# Patient Record
Sex: Male | Born: 1985 | Race: White | Hispanic: No | Marital: Married | Smoking: Never smoker
Health system: Southern US, Community
[De-identification: ages and names within clinical notes are randomized; demographics above are authoritative.]

## PROBLEM LIST (undated history)

## (undated) DIAGNOSIS — IMO0002 Reserved for concepts with insufficient information to code with codable children: Secondary | ICD-10-CM

## (undated) DIAGNOSIS — T7840XA Allergy, unspecified, initial encounter: Secondary | ICD-10-CM

## (undated) HISTORY — DX: Reserved for concepts with insufficient information to code with codable children: IMO0002

## (undated) HISTORY — DX: Allergy, unspecified, initial encounter: T78.40XA

---

## 2005-06-20 ENCOUNTER — Inpatient Hospital Stay: Payer: Self-pay | Admitting: Internal Medicine

## 2005-09-09 ENCOUNTER — Emergency Department: Payer: Self-pay | Admitting: Internal Medicine

## 2007-02-03 ENCOUNTER — Ambulatory Visit: Payer: Self-pay | Admitting: Family Medicine

## 2007-05-08 ENCOUNTER — Emergency Department: Payer: Self-pay | Admitting: Emergency Medicine

## 2007-05-19 ENCOUNTER — Ambulatory Visit: Payer: Self-pay | Admitting: Family Medicine

## 2007-05-25 ENCOUNTER — Ambulatory Visit: Payer: Self-pay | Admitting: Family Medicine

## 2007-05-27 ENCOUNTER — Ambulatory Visit: Payer: Self-pay | Admitting: Family Medicine

## 2007-06-07 ENCOUNTER — Ambulatory Visit: Payer: Self-pay | Admitting: Family Medicine

## 2007-06-20 ENCOUNTER — Ambulatory Visit: Payer: Self-pay | Admitting: Gastroenterology

## 2010-10-18 ENCOUNTER — Ambulatory Visit: Payer: Self-pay | Admitting: Family Medicine

## 2011-08-31 ENCOUNTER — Emergency Department: Payer: Self-pay | Admitting: Emergency Medicine

## 2011-09-07 ENCOUNTER — Emergency Department: Payer: Self-pay | Admitting: Emergency Medicine

## 2011-11-30 ENCOUNTER — Ambulatory Visit: Payer: Self-pay

## 2013-06-03 ENCOUNTER — Emergency Department: Payer: Self-pay | Admitting: Emergency Medicine

## 2014-10-08 ENCOUNTER — Other Ambulatory Visit: Payer: Self-pay | Admitting: Family Medicine

## 2014-10-08 DIAGNOSIS — N529 Male erectile dysfunction, unspecified: Secondary | ICD-10-CM

## 2014-10-08 MED ORDER — SILDENAFIL CITRATE 50 MG PO TABS: 50.0000 mg | ORAL_TABLET | Freq: Every day | ORAL | Status: DC | PRN

## 2016-05-06 ENCOUNTER — Encounter: Payer: Self-pay | Admitting: Family Medicine

## 2016-05-06 ENCOUNTER — Ambulatory Visit (INDEPENDENT_AMBULATORY_CARE_PROVIDER_SITE_OTHER): Payer: BLUE CROSS/BLUE SHIELD | Admitting: Family Medicine

## 2016-05-06 VITALS — BP 134/84 | HR 80 | Temp 99.5°F | Resp 16 | Ht 71.0 in | Wt 229.2 lb

## 2016-05-06 DIAGNOSIS — Z130 Encounter for screening for diseases of the blood and blood-forming organs and certain disorders involving the immune mechanism: Secondary | ICD-10-CM | POA: Diagnosis not present

## 2016-05-06 DIAGNOSIS — Z13220 Encounter for screening for lipoid disorders: Secondary | ICD-10-CM | POA: Diagnosis not present

## 2016-05-06 DIAGNOSIS — Z131 Encounter for screening for diabetes mellitus: Secondary | ICD-10-CM

## 2016-05-06 DIAGNOSIS — Z1329 Encounter for screening for other suspected endocrine disorder: Secondary | ICD-10-CM | POA: Diagnosis not present

## 2016-05-06 DIAGNOSIS — Z6831 Body mass index (BMI) 31.0-31.9, adult: Secondary | ICD-10-CM | POA: Diagnosis not present

## 2016-05-06 DIAGNOSIS — R0683 Snoring: Secondary | ICD-10-CM | POA: Diagnosis not present

## 2016-05-06 DIAGNOSIS — Z0000 Encounter for general adult medical examination without abnormal findings: Secondary | ICD-10-CM

## 2016-05-06 DIAGNOSIS — Z3009 Encounter for other general counseling and advice on contraception: Secondary | ICD-10-CM

## 2016-05-06 DIAGNOSIS — E6609 Other obesity due to excess calories: Secondary | ICD-10-CM

## 2016-05-06 LAB — POCT URINALYSIS DIP (MANUAL ENTRY)
Bilirubin, UA: NEGATIVE
Glucose, UA: NEGATIVE
Leukocytes, UA: NEGATIVE
Nitrite, UA: NEGATIVE
Protein Ur, POC: NEGATIVE
Spec Grav, UA: 1.02
Urobilinogen, UA: 0.2
pH, UA: 5.5

## 2016-05-06 NOTE — Progress Notes (Signed)
Subjective:    Patient ID: Joshua Mcfarland, male    DOB: 1985-08-21, 30 y.o.   MRN: [id number]  05/06/2016  Annual Exam   HPI This 31 y.o. male presents for Complete Physical Examination and to reestablish care.  Previous pateint five years ago in [place].   Immunization History  Administered Date(s) Administered  . Influenza-Unspecified 12/25/2015   BP Readings from Last 3 Encounters:  05/06/16 134/84   Wt Readings from Last 3 Encounters:  05/06/16 229 lb 3.2 oz (104 kg)     Review of Systems  Constitutional: Positive for fatigue. Negative for activity change, appetite change, chills, diaphoresis, fever and unexpected weight change.  HENT: Negative for congestion, dental problem, drooling, ear discharge, ear pain, facial swelling, hearing loss, mouth sores, nosebleeds, postnasal drip, rhinorrhea, sinus pressure, sneezing, sore throat, tinnitus, trouble swallowing and voice change.   Eyes: Negative for photophobia, pain, discharge, redness, itching and visual disturbance.  Respiratory: Negative for apnea, cough, choking, chest tightness, shortness of breath, wheezing and stridor.   Cardiovascular: Negative for chest pain, palpitations and leg swelling.  Gastrointestinal: Negative for abdominal pain, blood in stool, constipation, diarrhea, nausea and vomiting.  Endocrine: Negative for cold intolerance, heat intolerance, polydipsia, polyphagia and polyuria.  Genitourinary: Negative for decreased urine volume, difficulty urinating, discharge, dysuria, enuresis, flank pain, frequency, genital sores, hematuria, penile pain, penile swelling, scrotal swelling, testicular pain and urgency.  Musculoskeletal: Negative for arthralgias, back pain, gait problem, joint swelling, myalgias, neck pain and neck stiffness.  Skin: Negative for color change, pallor, rash and wound.  Allergic/Immunologic: Negative for environmental allergies, food allergies and immunocompromised state.    Neurological: Negative for dizziness, tremors, seizures, syncope, facial asymmetry, speech difficulty, weakness, light-headedness, numbness and headaches.  Hematological: Negative for adenopathy. Does not bruise/bleed easily.  Psychiatric/Behavioral: Negative for agitation, behavioral problems, confusion, decreased concentration, dysphoric mood, hallucinations, self-injury, sleep disturbance and suicidal ideas. The patient is not nervous/anxious and is not hyperactive.     Past Medical History:  Diagnosis Date  . Allergy   . Ulcer ([place])    History reviewed. No pertinent surgical history. No Known Allergies Current Outpatient Prescriptions  Medication Sig Dispense Refill  . sildenafil (VIAGRA) 50 MG tablet Take 1 tablet (50 mg total) by mouth daily as needed for erectile dysfunction. (Patient not taking: Reported on 05/06/2016) 24 tablet 3   No current facility-administered medications for this visit.    Social History   Social History  . Marital status: Married    Spouse name: N/A  . Number of children: 3  . 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 Smoking status: Never Smoker  . Smokeless tobacco: Never Used  . Alcohol use No  . Drug use: No  . Sexual activity: Yes    Partners: Female   Other Topics Concern  . Not on file   Social History Narrative   Wife stays home with the children   Wears seatbelt when driving   Does own firearms but they are secured    Family History  Problem Relation Age of Onset  . Hyperlipidemia Father   . Hypertension Father   . Diabetes Paternal Uncle   . Stroke Maternal Grandfather   . Heart disease Paternal Grandmother   . Diabetes Paternal Grandfather   . Hyperlipidemia Paternal Grandfather   . Hypertension Paternal Grandfather        Objective:  BP 134/84 (BP Location: Left Arm, Patient Position: Sitting, Cuff Size: Large)   Pulse 80   Temp 99.5 F  (37.5 C) (Oral)   Resp 16   Ht 5\' 11"  (1.803 m)   Wt 229 lb 3.2 oz (104 kg)   SpO2 97%   BMI 31.97 kg/m  Physical Exam  Constitutional: He is oriented to person, place, and time. He appears well-developed and well-nourished. No distress.  HENT:  Head: Normocephalic and atraumatic.  Right Ear: External ear normal.  Left Ear: External ear normal.  Nose: Nose normal.  Mouth/Throat: Oropharynx is clear and moist.  Eyes: Conjunctivae and EOM are normal. Pupils are equal, round, and reactive to light.  Neck: Normal range of motion. Neck supple. Carotid bruit is not present. No thyromegaly present.  Cardiovascular: Normal rate, regular rhythm, normal heart sounds and intact distal pulses.  Exam reveals no gallop and no friction rub.   No murmur heard. Pulmonary/Chest: Effort normal and breath sounds normal. He has no wheezes. He has no rales.  Abdominal: Soft. Bowel sounds are normal. He exhibits no distension and no mass. There is no tenderness. There is no rebound and no guarding. Hernia confirmed negative in the right inguinal area and confirmed negative in the left inguinal area.  Genitourinary: Testes normal and penis normal.  Musculoskeletal:       Right shoulder: Normal.       Left shoulder: Normal.       Cervical back: Normal.  Lymphadenopathy:    He has no cervical adenopathy.       Right: No inguinal adenopathy present.       Left: No inguinal adenopathy present.  Neurological: He is alert and oriented to person, place, and time. He has normal reflexes. No cranial nerve deficit. He exhibits normal muscle tone. Coordination normal.  Skin: Skin is warm and dry. No rash noted. He is not diaphoretic.  Psychiatric: He has a normal mood and affect. His behavior is normal. Judgment and thought content normal.   Depression screen PHQ 2/9 05/06/2016  Decreased Interest 0  Down, Depressed, Hopeless 0  PHQ - 2 Score 0   Fall Risk  05/06/2016  Falls in the past year? No          Assessment & Plan:   1. Routine physical examination   2. Screening for hyperlipidemia   3. Screening for iron deficiency anemia   4. Screening for hypothyroidism   5. Screening for diabetes mellitus   6. Snoring   7. Family planning counseling   8. Class 1 obesity due to excess calories without serious comorbidity with body mass index (BMI) of 31.0 to 31.9 in adult    -anticipatory guidance --- exercise, weight loss, safe driving practices. -obtain age appropriate screening labs. -refer to urology to undergo vasectomy. -refer for sleep medicine due to snoring and obesity. Orders Placed This Encounter  Procedures  . Lipid panel  . Comprehensive metabolic panel  . CBC with Differential/Platelet  . TSH  . Urine Microscopic  . Hemoglobin A1c  . Ambulatory referral to Urology    Referral Priority:   Routine    Referral Type:   Consultation    Referral Reason:   Specialty Services Required    Requested Specialty:   Urology    Number of Visits Requested:   1  . Ambulatory referral to Sleep Studies    Referral Priority:   Routine    Referral Type:   Consultation    Referral Reason:  Specialty Services Required    Requested Specialty:   Sleep Medicine    Number of Visits Requested:   1  . POCT urinalysis dipstick   No orders of the defined types were placed in this encounter.   Return in about 1 year (around 05/06/2017) for complete physical examiniation.   Aldous Housel Elayne Guerin, M.D. Primary Care at [facility] [address] [place], [place]  [phone] phone [phone] fax

## 2016-05-06 NOTE — Patient Instructions (Addendum)
IF you received an x-ray today, you will receive an invoice from [facility]. Please contact [facility] at [phone] with questions or concerns regarding your invoice.   IF you received labwork today, you will receive an invoice from [facility]. Please contact [facility] at [phone] with questions or concerns regarding your invoice.   Our billing staff will not be able to assist you with questions regarding bills from these companies.  You will be contacted with the lab results as soon as they are available. The fastest way to get your results is to activate your My Chart account. Instructions are located on the last page of this paperwork. If you have not heard from [place] regarding the results in 2 weeks, please contact this office.     PREVE Preventive Care 18-39 Years, Male Preventive care refers to lifestyle choices and visits with your health care provider that can promote health and wellness. What does preventive care include?  A yearly physical exam. This is also called an annual well check.  Dental exams once or twice a year.  Routine eye exams. Ask your health care provider how often you should have your eyes checked.  Personal lifestyle choices, including:  Daily care of your teeth and gums.  Regular physical activity.  Eating a healthy diet.  Avoiding tobacco and drug use.  Limiting alcohol use.  Practicing safe sex. What happens during an annual well check? The services and screenings done by your health care provider during your annual well check will depend on your age, overall health, lifestyle risk factors, and family history of disease. Counseling  Your health care provider may ask you questions about your:  Alcohol use.  Tobacco use.  Drug use.  Emotional well-being.  Home and relationship well-being.  Sexual activity.  Eating habits.  Work and work Statistician. Screening  You may have the following tests or  measurements:  Height, weight, and BMI.  Blood pressure.  Lipid and cholesterol levels. These may be checked every 5 years starting at age 38.  Diabetes screening. This is done by checking your blood sugar (glucose) after you have not eaten for a while (fasting).  Skin check.  Hepatitis C blood test.  Hepatitis B blood test.  Sexually transmitted disease (STD) testing. Discuss your test results, treatment options, and if necessary, the need for more tests with your health care provider. Vaccines  Your health care provider may recommend certain vaccines, such as:  Influenza vaccine. This is recommended every year.  Tetanus, diphtheria, and acellular pertussis (Tdap, Td) vaccine. You may need a Td booster every 10 years.  Varicella vaccine. You may need this if you have not been vaccinated.  HPV vaccine. If you are 19 or younger, you may need three doses over 6 months.  Measles, mumps, and rubella (MMR) vaccine. You may need at least one dose of MMR.You may also need a second dose.  Pneumococcal 13-valent conjugate (PCV13) vaccine. You may need this if you have certain conditions and have not been vaccinated.  Pneumococcal polysaccharide (PPSV23) vaccine. You may need one or two doses if you smoke cigarettes or if you have certain conditions.  Meningococcal vaccine. One dose is recommended if you are age 32-21 years and a first-year college student living in a residence hall, or if you have one of several medical conditions. You may also need additional booster doses.  Hepatitis A vaccine. You may need this if you have certain conditions or if you travel or work  places where you may be exposed to hepatitis A.  Hepatitis B vaccine. You may need this if you have certain conditions or if you travel or work in places where you may be exposed to hepatitis B.  Haemophilus influenzae type b (Hib) vaccine. You may need this if you have certain risk factors.  Talk to your health  care provider about which screenings and vaccines you need and how often you need them. This information is not intended to replace advice given to you by your health care provider. Make sure you discuss any questions you have with your health care provider. Document Released: 04/07/2001 Document Revised: 10/30/2015 Document Reviewed: 12/11/2014 Elsevier Interactive Patient Education  2017 Elsevier Inc.  

## 2016-05-06 NOTE — Progress Notes (Signed)
Patient ID: Joshua Mcfarland, male    DOB: Apr 08, 1985, 31 y.o.   MRN: [id number]  PCP: Reginia Forts, MD  Chief Complaint  Patient presents with  . Annual Exam    Subjective:   Presents for [employer]. No specific complaints today. Does want to discuss vasectomy and sleep apnea study.  Last Physical exam was last year Last Dentist visit was last year Last Eye doctor appointment over two years ago Immunizations are up to date, his nurse at work will send the records to our office.   These are the goals we discussed: Goals    None      This is a list of the screening recommended for you and due dates:  Health Maintenance  Topic Date Due  . HIV Screening  08/03/2000  . Tetanus Vaccine  08/03/2004  . Flu Shot  09/24/2015    Patient Active Problem List   Diagnosis Date Noted  . Screening for hyperlipidemia 05/06/2016  . Screening for hypothyroidism 05/06/2016    Past Medical History:  Diagnosis Date  . Allergy   . Ulcer ([place])      Prior to Admission medications   Medication Sig Start Date End Date Taking? Authorizing Provider  sildenafil (VIAGRA) 50 MG tablet Take 1 tablet (50 mg total) by mouth daily as needed for erectile dysfunction. Patient not taking: Reported on 05/06/2016 10/08/14   Carmon Ginsberg, PA    No Known Allergies  History reviewed. No pertinent surgical history.  Family History  Problem Relation Age of Onset  . Hyperlipidemia Father   . Hypertension Father   . Diabetes Paternal Uncle   . Stroke Maternal Grandfather   . Heart disease Paternal Grandmother   . Diabetes Paternal Grandfather   . Hyperlipidemia Paternal Grandfather   . Hypertension Paternal Grandfather     Social History   Social History  . Marital status: Married    Spouse name: N/A  . Number of children: 3  . 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 Smoking status: Never Smoker  .  Smokeless tobacco: Never Used  . Alcohol use No  . Drug use: No  . Sexual activity: Yes    Partners: Female   Other Topics Concern  . None   Social History Narrative   Wife stays home with the children   Wears seatbelt when driving   Does own firearms but they are secured     Review of Systems  Allergic/Immunologic: Positive for environmental allergies (Seasonal ).  All other systems reviewed and are negative.    Objective:  Physical Exam  Constitutional: He is oriented to person, place, and time. He appears well-developed and well-nourished.  HENT:  Head: Normocephalic.  Right Ear: External ear normal.  Left Ear: External ear normal.  Nose: Nose normal.  Mouth/Throat: Oropharynx is clear and moist.  Eyes: Pupils are equal, round, and reactive to light.  Neck: Normal range of motion. Neck supple.  Cardiovascular: Normal rate, regular rhythm, normal heart sounds and intact distal pulses.   Pulmonary/Chest: Effort normal and breath sounds normal.  Abdominal: Soft. Bowel sounds are normal.  Musculoskeletal: Normal range of motion.  Neurological: He is alert and oriented to person, place, and time. He has normal reflexes.  Skin: Skin is warm and dry.  Psychiatric: He has a normal mood and affect. His behavior is normal. Judgment and thought content  normal.     Assessment & Plan:  1. Routine physical examination  2. Screening for hyperlipidemia - Lipid panel - Comprehensive metabolic panel  3. Screening for iron deficiency anemia - CBC with Differential/Platelet  4. Screening for hypothyroidism - TSH  5. Screening for diabetes mellitus - Urine Microscopic - POCT urinalysis dipstick - Hemoglobin A1c  6. Snoring - Ambulatory referral to Sleep Studies  7. Family planning counseling - Ambulatory referral to Urology

## 2016-05-07 LAB — COMPREHENSIVE METABOLIC PANEL
ALK PHOS: 83 IU/L (ref 39–117)
ALT: 66 IU/L — ABNORMAL HIGH (ref 0–44)
AST: 30 IU/L (ref 0–40)
Albumin/Globulin Ratio: 1.4 (ref 1.2–2.2)
Albumin: 4.7 g/dL (ref 3.5–5.5)
BUN/Creatinine Ratio: 15 (ref 9–20)
BUN: 15 mg/dL (ref 6–20)
Bilirubin Total: 0.6 mg/dL (ref 0.0–1.2)
CALCIUM: 9.6 mg/dL (ref 8.7–10.2)
CO2: 23 mmol/L (ref 18–29)
CREATININE: 0.97 mg/dL (ref 0.76–1.27)
Chloride: 97 mmol/L (ref 96–106)
GFR calc Af Amer: 121 mL/min/{1.73_m2} (ref >59)
GFR, EST NON AFRICAN AMERICAN: 104 mL/min/{1.73_m2} (ref >59)
GLOBULIN, TOTAL: 3.3 g/dL (ref 1.5–4.5)
Glucose: 82 mg/dL (ref 65–99)
POTASSIUM: 4 mmol/L (ref 3.5–5.2)
SODIUM: 140 mmol/L (ref 134–144)
Total Protein: 8 g/dL (ref 6.0–8.5)

## 2016-05-07 LAB — CBC WITH DIFFERENTIAL/PLATELET
Basophils Absolute: 0 10*3/uL (ref 0.0–0.2)
Basos: 0 %
EOS (ABSOLUTE): 0.2 10*3/uL (ref 0.0–0.4)
EOS: 2 %
HEMATOCRIT: 47.3 % (ref 37.5–51.0)
Hemoglobin: 16.8 g/dL (ref 13.0–17.7)
IMMATURE GRANS (ABS): 0.1 10*3/uL (ref 0.0–0.1)
IMMATURE GRANULOCYTES: 1 %
LYMPHS: 26 %
Lymphocytes Absolute: 3.5 10*3/uL — ABNORMAL HIGH (ref 0.7–3.1)
MCH: 29.3 pg (ref 26.6–33.0)
MCHC: 35.5 g/dL (ref 31.5–35.7)
MCV: 83 fL (ref 79–97)
MONOS ABS: 0.9 10*3/uL (ref 0.1–0.9)
Monocytes: 7 %
NEUTROS PCT: 64 %
Neutrophils Absolute: 8.6 10*3/uL — ABNORMAL HIGH (ref 1.4–7.0)
Platelets: 299 10*3/uL (ref 150–379)
RBC: 5.73 x10E6/uL (ref 4.14–5.80)
RDW: 13.2 % (ref 12.3–15.4)
WBC: 13.3 10*3/uL — AB (ref 3.4–10.8)

## 2016-05-07 LAB — HEMOGLOBIN A1C
Est. average glucose Bld gHb Est-mCnc: 108 mg/dL
Hgb A1c MFr Bld: 5.4 % (ref 4.8–5.6)

## 2016-05-07 LAB — LIPID PANEL
CHOL/HDL RATIO: 5.2 ratio — AB (ref 0.0–5.0)
CHOLESTEROL TOTAL: 212 mg/dL — AB (ref 100–199)
HDL: 41 mg/dL (ref >39)
LDL CALC: 139 mg/dL — AB (ref 0–99)
Triglycerides: 161 mg/dL — ABNORMAL HIGH (ref 0–149)
VLDL CHOLESTEROL CAL: 32 mg/dL (ref 5–40)

## 2016-05-07 LAB — URINALYSIS, MICROSCOPIC ONLY
Casts: NONE SEEN /lpf
EPITHELIAL CELLS (NON RENAL): NONE SEEN /HPF (ref 0–10)

## 2016-05-07 LAB — TSH: TSH: 1.95 u[IU]/mL (ref 0.450–4.500)

## 2016-05-13 ENCOUNTER — Encounter: Payer: Self-pay | Admitting: Family Medicine

## 2016-05-13 NOTE — Telephone Encounter (Signed)
Can you look into the sleep study that was placed 3/14 from dr. Tamala Julian

## 2016-06-16 ENCOUNTER — Ambulatory Visit: Payer: BLUE CROSS/BLUE SHIELD | Attending: Internal Medicine

## 2016-06-16 DIAGNOSIS — G4733 Obstructive sleep apnea (adult) (pediatric): Secondary | ICD-10-CM | POA: Insufficient documentation

## 2016-06-16 DIAGNOSIS — R0683 Snoring: Secondary | ICD-10-CM | POA: Diagnosis present

## 2016-06-20 DIAGNOSIS — E6609 Other obesity due to excess calories: Secondary | ICD-10-CM | POA: Insufficient documentation

## 2016-06-20 DIAGNOSIS — Z6831 Body mass index (BMI) 31.0-31.9, adult: Secondary | ICD-10-CM

## 2016-06-24 ENCOUNTER — Encounter: Payer: Self-pay | Admitting: Family Medicine

## 2016-06-29 ENCOUNTER — Encounter: Payer: Self-pay | Admitting: Family Medicine

## 2016-06-30 ENCOUNTER — Encounter: Payer: Self-pay | Admitting: Family Medicine

## 2016-06-30 DIAGNOSIS — G4733 Obstructive sleep apnea (adult) (pediatric): Secondary | ICD-10-CM

## 2016-07-22 ENCOUNTER — Encounter: Payer: Self-pay | Admitting: Family Medicine

## 2016-07-24 NOTE — Telephone Encounter (Signed)
Referrals, patient sent MyChart message regarding sleep study for CPAP titration.  Looks like information was faxed today.  Can you please contact pt via phone and via mychart when you have received an appointment time?

## 2016-08-05 ENCOUNTER — Ambulatory Visit: Payer: BLUE CROSS/BLUE SHIELD | Attending: Neurology

## 2016-08-05 DIAGNOSIS — G4733 Obstructive sleep apnea (adult) (pediatric): Secondary | ICD-10-CM | POA: Diagnosis present

## 2016-08-05 DIAGNOSIS — I493 Ventricular premature depolarization: Secondary | ICD-10-CM | POA: Diagnosis not present

## 2016-08-05 DIAGNOSIS — I491 Atrial premature depolarization: Secondary | ICD-10-CM | POA: Diagnosis not present

## 2016-08-05 DIAGNOSIS — R0683 Snoring: Secondary | ICD-10-CM | POA: Insufficient documentation

## 2016-08-18 ENCOUNTER — Telehealth: Payer: Self-pay | Admitting: Family Medicine

## 2016-08-18 DIAGNOSIS — G4733 Obstructive sleep apnea (adult) (pediatric): Secondary | ICD-10-CM

## 2016-08-18 NOTE — Telephone Encounter (Signed)
Pt is looking for his sleep study results   Best number [phone]

## 2016-08-18 NOTE — Telephone Encounter (Signed)
Dr. Tamala Julian , please release this results, to call the patient. Thanks.

## 2016-08-31 NOTE — Telephone Encounter (Signed)
Please fax order for CPAP machine to [place] for delivery and education for patient.

## 2016-08-31 NOTE — Telephone Encounter (Signed)
Order for CPAP placed in box for signiture

## 2016-09-25 ENCOUNTER — Telehealth: Payer: Self-pay | Admitting: Family Medicine

## 2016-09-25 DIAGNOSIS — G4733 Obstructive sleep apnea (adult) (pediatric): Secondary | ICD-10-CM

## 2016-09-25 NOTE — Telephone Encounter (Signed)
Pt states he has not got the orders for his sleep study.  Please adv.  Contact number [phone]

## 2016-09-26 NOTE — Telephone Encounter (Signed)
Do you have a referral for this already

## 2016-09-29 NOTE — Telephone Encounter (Signed)
The last referral for sleep study was in May of 2018. Sleep referrals are different than orders. I am not able to send orders but if the provider places orders they can be sent to the facility for the sleep study to be done if that it what the pt is referring to. I am not sure if there are supposed to be sleep orders for pt or not.

## 2016-10-01 NOTE — Telephone Encounter (Signed)
Please advise 

## 2016-10-05 NOTE — Telephone Encounter (Signed)
Order for CPAP placed; please coordinate with [facility] facility [facility] or [facility]) to deliver CPAP and provide education.

## 2016-10-05 NOTE — Telephone Encounter (Signed)
Order faxed to [facility]

## 2016-10-06 NOTE — Telephone Encounter (Signed)
Spoke with [facility] and they did receive order and will contact the pt to advise him on obtaining CPAP and instructions, insurance information, and financial information concerning this.

## 2016-10-28 DIAGNOSIS — I10 Essential (primary) hypertension: Secondary | ICD-10-CM | POA: Diagnosis not present

## 2016-10-28 DIAGNOSIS — G4733 Obstructive sleep apnea (adult) (pediatric): Secondary | ICD-10-CM | POA: Diagnosis not present

## 2016-11-05 ENCOUNTER — Emergency Department
Admission: EM | Admit: 2016-11-05 | Discharge: 2016-11-05 | Disposition: A | Discharge status: Home / Self Care | Payer: BLUE CROSS/BLUE SHIELD | Attending: Emergency Medicine | Admitting: Emergency Medicine

## 2016-11-05 ENCOUNTER — Encounter: Payer: Self-pay | Admitting: *Deleted

## 2016-11-05 DIAGNOSIS — S61412A Laceration without foreign body of left hand, initial encounter: Secondary | ICD-10-CM | POA: Diagnosis not present

## 2016-11-05 DIAGNOSIS — W260XXA Contact with knife, initial encounter: Secondary | ICD-10-CM | POA: Insufficient documentation

## 2016-11-05 DIAGNOSIS — Y9389 Activity, other specified: Secondary | ICD-10-CM | POA: Diagnosis not present

## 2016-11-05 DIAGNOSIS — Y999 Unspecified external cause status: Secondary | ICD-10-CM | POA: Insufficient documentation

## 2016-11-05 DIAGNOSIS — Y929 Unspecified place or not applicable: Secondary | ICD-10-CM | POA: Diagnosis not present

## 2016-11-05 DIAGNOSIS — S6992XA Unspecified injury of left wrist, hand and finger(s), initial encounter: Secondary | ICD-10-CM | POA: Diagnosis not present

## 2016-11-05 MED ORDER — LIDOCAINE HCL 1 % IJ SOLN: 5.0000 mL | Freq: Once | INTRAMUSCULAR | Status: DC

## 2016-11-05 MED ORDER — CEPHALEXIN 500 MG PO CAPS: 500.0000 mg | ORAL_CAPSULE | Freq: Three times a day (TID) | ORAL | 0 refills | Status: AC

## 2016-11-05 MED ORDER — LIDOCAINE HCL (PF) 1 % IJ SOLN
5.0000 mL | Freq: Once | INTRAMUSCULAR | Status: AC
  Administered 2016-11-05: 5 mL
  Filled 2016-11-05: qty 5

## 2016-11-05 NOTE — ED Provider Notes (Signed)
[facility] Emergency Department Provider Note  ____________________________________________  Time seen: Approximately 5:58 PM  I have reviewed the triage vital signs and the nursing notes.   HISTORY  Chief Complaint Laceration    HPI Joshua Mcfarland is a 31 y.o. male presenting to the emergency department with a left hand laceration. Patient states that he was attempting to take down a trampoline with a switch blade that slipped. He denies weakness, radiculopathy or changes in sensation of the left hand. Tetanus status updated. No alleviating measures have been attempted.   Past Medical History:  Diagnosis Date  . Allergy   . Ulcer     Patient Active Problem List   Diagnosis Date Noted  . Class 1 obesity due to excess calories without serious comorbidity with body mass index (BMI) of 31.0 to 31.9 in adult 06/20/2016  . Screening for hyperlipidemia 05/06/2016  . Screening for hypothyroidism 05/06/2016    No past surgical history on file.  Prior to Admission medications   Medication Sig Start Date End Date Taking? Authorizing Provider  cephALEXin (KEFLEX) 500 MG capsule Take 1 capsule (500 mg total) by mouth 3 (three) times daily. 11/05/16 11/15/16  Lannie Fields, PA-C  sildenafil (VIAGRA) 50 MG tablet Take 1 tablet (50 mg total) by mouth daily as needed for erectile dysfunction. Patient not taking: Reported on 05/06/2016 10/08/14   Carmon Ginsberg, PA    Allergies Patient has no known allergies.  Family History  Problem Relation Age of Onset  . Hyperlipidemia Father   . Hypertension Father   . Diabetes Paternal Uncle   . Stroke Maternal Grandfather   . Heart disease Paternal Grandmother   . Diabetes Paternal Grandfather   . Hyperlipidemia Paternal Grandfather   . Hypertension Paternal Grandfather     Social History Social History  Substance Use Topics  . Smoking status: Never Smoker  . Smokeless tobacco: Never Used  . Alcohol use No      Review of Systems  Constitutional: No fever/chills Eyes: No visual changes. No discharge ENT: No upper respiratory complaints. Cardiovascular: no chest pain. Respiratory: no cough. No SOB. Musculoskeletal: Negative for musculoskeletal pain. Skin: Patient has left hand laceration.  Neurological: Negative for headaches, focal weakness or numbness.   ____________________________________________   PHYSICAL EXAM:  VITAL SIGNS: ED Triage Vitals  Enc Vitals Group     BP 11/05/16 1648 (!) 147/91     Pulse Rate 11/05/16 1648 80     Resp 11/05/16 1648 18     Temp 11/05/16 1648 99.2 F (37.3 C)     Temp Source 11/05/16 1648 Oral     SpO2 11/05/16 1648 99 %     Weight 11/05/16 1648 218 lb (98.9 kg)     Height 11/05/16 1648 5\' 10"  (1.778 m)     Head Circumference --      Peak Flow --      Pain Score 11/05/16 1647 4     Pain Loc --      Pain Edu? --      Excl. in [place]? --      Constitutional: Alert and oriented. Well appearing and in no acute distress. Eyes: Conjunctivae are normal. PERRL. EOMI. Head: Atraumatic. Cardiovascular: Normal rate, regular rhythm. Normal S1 and S2.  Good peripheral circulation. Respiratory: Normal respiratory effort without tachypnea or retractions. Lungs CTAB. Good air entry to the bases with no decreased or absent breath sounds. Musculoskeletal: Patient is able to perform flexion and extension at the right  wrist. Patient is able to move all 5 right fingers. Patient is able to perform resisted reduction and extension at all 5 fingers. Palpable radial pulse, left. Neurologic:  Normal speech and language. No gross focal neurologic deficits are appreciated.  Skin: Patient has 2.5 cm laceration of left hand.  Psychiatric: Mood and affect are normal. Speech and behavior are normal. Patient exhibits appropriate insight and judgement.   ____________________________________________   LABS (all labs ordered are listed, but only abnormal results are  displayed)  Labs Reviewed - No data to display ____________________________________________  EKG   ____________________________________________  RADIOLOGY   No results found.  ____________________________________________    PROCEDURES  Procedure(s) performed:    Procedures  LACERATION REPAIR Performed by: Lannie Fields Authorized by: Lannie Fields Consent: Verbal consent obtained. Risks and benefits: risks, benefits and alternatives were discussed Consent given by: patient Patient identity confirmed: provided demographic data Prepped and Draped in normal sterile fashion Wound was irrigated with 500 mL of normal saline and prepped with Betadine.  Laceration Location: Volar aspect of left hand  Laceration Length: 2.5 cm  No Foreign Bodies seen or palpated  Anesthesia: local infiltration  Local anesthetic: lidocaine 1% without epinephrine  Anesthetic total: 5 ml  Irrigation method: syringe Amount of cleaning: standard  Skin closure: 3-0 Ethilon   Number of sutures: 7  Technique: Simple Interrupted.   Patient tolerance: Patient tolerated the procedure well with no immediate complications.   Medications  lidocaine (PF) (XYLOCAINE) 1 % injection 5 mL (not administered)     ____________________________________________   INITIAL IMPRESSION / ASSESSMENT AND PLAN / ED COURSE  Pertinent labs & imaging results that were available during my care of the patient were reviewed by me and considered in my medical decision making (see chart for details).  Review of the [place] CSRS was performed in accordance of the [place] prior to dispensing any controlled drugs.     Assessment and Plan:  Left hand laceration Patient presents to the emergency department with a 2.5 cm laceration along the volar aspect of left hand. Patient underwent laceration repair without complication. Patient was advised to have sutures removed by primary care in 9 days. Patient was  discharged with Keflex. All patient questions were answered.   ____________________________________________  FINAL CLINICAL IMPRESSION(S) / ED DIAGNOSES  Final diagnoses:  Laceration of left hand without foreign body, initial encounter      NEW MEDICATIONS STARTED DURING THIS VISIT:  New Prescriptions   CEPHALEXIN (KEFLEX) 500 MG CAPSULE    Take 1 capsule (500 mg total) by mouth 3 (three) times daily.        This chart was dictated using voice recognition software/Dragon. Despite best efforts to proofread, errors can occur which can change the meaning. Any change was purely unintentional.    Lannie Fields, PA-C 11/05/16 1805    Arta Silence, MD 11/05/16 2154

## 2016-11-05 NOTE — ED Notes (Signed)
See triage note  States he was taking a trampoline down and the blade slipped  Laceration noted near web space and into hand

## 2016-11-05 NOTE — ED Triage Notes (Signed)
Pt has laceration to left hand.  Pt cut hand on a razor.  Bleeding controlled.  Pt alert.

## 2016-11-05 NOTE — ED Notes (Signed)
Pt wound cleaned and wrapped with non adhesive bandage and curlex. Pt verbalizes understanding of d/c teaching and Rx. Pt in NAD at time of d/c

## 2016-11-27 DIAGNOSIS — I10 Essential (primary) hypertension: Secondary | ICD-10-CM | POA: Diagnosis not present

## 2016-11-27 DIAGNOSIS — G4733 Obstructive sleep apnea (adult) (pediatric): Secondary | ICD-10-CM | POA: Diagnosis not present

## 2016-12-16 ENCOUNTER — Ambulatory Visit (INDEPENDENT_AMBULATORY_CARE_PROVIDER_SITE_OTHER): Payer: BLUE CROSS/BLUE SHIELD | Admitting: Family Medicine

## 2016-12-16 ENCOUNTER — Encounter: Payer: Self-pay | Admitting: Family Medicine

## 2016-12-16 VITALS — BP 118/70 | HR 72 | Temp 98.0°F | Resp 16 | Ht 71.26 in | Wt 230.0 lb

## 2016-12-16 DIAGNOSIS — D72825 Bandemia: Secondary | ICD-10-CM | POA: Diagnosis not present

## 2016-12-16 DIAGNOSIS — G4733 Obstructive sleep apnea (adult) (pediatric): Secondary | ICD-10-CM | POA: Diagnosis not present

## 2016-12-16 DIAGNOSIS — R7989 Other specified abnormal findings of blood chemistry: Secondary | ICD-10-CM

## 2016-12-16 DIAGNOSIS — E7800 Pure hypercholesterolemia, unspecified: Secondary | ICD-10-CM

## 2016-12-16 DIAGNOSIS — R945 Abnormal results of liver function studies: Secondary | ICD-10-CM | POA: Diagnosis not present

## 2016-12-16 NOTE — Patient Instructions (Addendum)
   IF you received an x-ray today, you will receive an invoice from [facility]. Please contact [facility] at [phone] with questions or concerns regarding your invoice.   IF you received labwork today, you will receive an invoice from [facility]. Please contact [facility] at [phone] with questions or concerns regarding your invoice.   Our billing staff will not be able to assist you with questions regarding bills from these companies.  You will be contacted with the lab results as soon as they are available. The fastest way to get your results is to activate your My Chart account. Instructions are located on the last page of this paperwork. If you have not heard from us regarding the results in 2 weeks, please contact this office.      Sleep Apnea Sleep apnea is a condition in which breathing pauses or becomes shallow during sleep. Episodes of sleep apnea usually last 10 seconds or longer, and they may occur as many as 20 times an hour. Sleep apnea disrupts your sleep and keeps your body from getting the rest that it needs. This condition can increase your risk of certain health problems, including:  Heart attack.  Stroke.  Obesity.  Diabetes.  Heart failure.  Irregular heartbeat.  There are three kinds of sleep apnea:  Obstructive sleep apnea. This kind is caused by a blocked or collapsed airway.  Central sleep apnea. This kind happens when the part of the brain that controls breathing does not send the correct signals to the muscles that control breathing.  Mixed sleep apnea. This is a combination of obstructive and central sleep apnea.  What are the causes? The most common cause of this condition is a collapsed or blocked airway. An airway can collapse or become blocked if:  Your throat muscles are abnormally relaxed.  Your tongue and tonsils are larger than normal.  You are overweight.  Your airway is smaller than normal.  What increases  the risk? This condition is more likely to develop in people who:  Are overweight.  Smoke.  Have a smaller than normal airway.  Are elderly.  Are male.  Drink alcohol.  Take sedatives or tranquilizers.  Have a family history of sleep apnea.  What are the signs or symptoms? Symptoms of this condition include:  Trouble staying asleep.  Daytime sleepiness and tiredness.  Irritability.  Loud snoring.  Morning headaches.  Trouble concentrating.  Forgetfulness.  Decreased interest in sex.  Unexplained sleepiness.  Mood swings.  Personality changes.  Feelings of depression.  Waking up often during the night to urinate.  Dry mouth.  Sore throat.  How is this diagnosed? This condition may be diagnosed with:  A medical history.  A physical exam.  A series of tests that are done while you are sleeping (sleep study). These tests are usually done in a sleep lab, but they may also be done at home.  How is this treated? Treatment for this condition aims to restore normal breathing and to ease symptoms during sleep. It may involve managing health issues that can affect breathing, such as high blood pressure or obesity. Treatment may include:  Sleeping on your side.  Using a decongestant if you have nasal congestion.  Avoiding the use of depressants, including alcohol, sedatives, and narcotics.  Losing weight if you are overweight.  Making changes to your diet.  Quitting smoking.  Using a device to open your airway while you sleep, such as: ? An oral appliance. This is a   custom-made mouthpiece that shifts your lower jaw forward. ? A continuous positive airway pressure (CPAP) device. This device delivers oxygen to your airway through a mask. ? A nasal expiratory positive airway pressure (EPAP) device. This device has valves that you put into each nostril. ? A bi-level positive airway pressure (BPAP) device. This device delivers oxygen to your airway  through a mask.  Surgery if other treatments do not work. During surgery, excess tissue is removed to create a wider airway.  It is important to get treatment for sleep apnea. Without treatment, this condition can lead to:  High blood pressure.  Coronary artery disease.  (Men) An inability to achieve or maintain an erection (impotence).  Reduced thinking abilities.  Follow these instructions at home:  Make any lifestyle changes that your health care provider recommends.  Eat a healthy, well-balanced diet.  Take over-the-counter and prescription medicines only as told by your health care provider.  Avoid using depressants, including alcohol, sedatives, and narcotics.  Take steps to lose weight if you are overweight.  If you were given a device to open your airway while you sleep, use it only as told by your health care provider.  Do not use any tobacco products, such as cigarettes, chewing tobacco, and e-cigarettes. If you need help quitting, ask your health care provider.  Keep all follow-up visits as told by your health care provider. This is important. Contact a health care provider if:  The device that you received to open your airway during sleep is uncomfortable or does not seem to be working.  Your symptoms do not improve.  Your symptoms get worse. Get help right away if:  You develop chest pain.  You develop shortness of breath.  You develop discomfort in your back, arms, or stomach.  You have trouble speaking.  You have weakness on one side of your body.  You have drooping in your face. These symptoms may represent a serious problem that is an emergency. Do not wait to see if the symptoms will go away. Get medical help right away. Call your local emergency services (911 in the [place] Do not drive yourself to the hospital. This information is not intended to replace advice given to you by your health care provider. Make sure you discuss any questions you  have with your health care provider. Document Released: 01/30/2002 Document Revised: 10/06/2015 Document Reviewed: 11/19/2014 Elsevier Interactive Patient Education  2018 Elsevier Inc.  

## 2016-12-16 NOTE — Progress Notes (Signed)
Subjective:    Patient ID: Joshua Mcfarland, male    DOB: 1986/01/24, 31 y.o.   MRN: [id number]  12/16/2016  Sleep Apnea (follow-up pt states since he started the Cpap machine his sleep has been much better. )    HPI This 31 y.o. male presents for six month follow-up of new onset OSA, hypercholesterolemia, elevated LFTs, and obesity.  Started using CPAP on 10/24/2016.  Wears all night long.  Falls asleep varies.  Has APP on phone. myAir score 98/100. 7:43 usage hours 70/70 score Good mask seal 20/20 1.1 events per hour 4 mask on/off Using 5-10 hours per night.    No longer groggy; feels rested.    anticipatory guidance --- exercise, weight loss, safe driving practices. -obtain age appropriate screening labs. -refer to urology to undergo vasectomy. -refer for sleep medicine due to snoring and obesity  Baby was born in June.   Cut out greasy foods; only eats deer meat. Hunting a lot.     BP Readings from Last 3 Encounters:  12/16/16 118/70  11/05/16 (!) 141/89  05/06/16 134/84   Wt Readings from Last 3 Encounters:  12/16/16 230 lb (104.3 kg)  11/05/16 218 lb (98.9 kg)  05/06/16 229 lb 3.2 oz (104 kg)   Immunization History  Administered Date(s) Administered  . Influenza-Unspecified 12/25/2015  . Tdap 11/15/2008    Review of Systems  Constitutional: Negative for activity change, appetite change, chills, diaphoresis, fatigue and fever.  Respiratory: Negative for cough and shortness of breath.   Cardiovascular: Negative for chest pain, palpitations and leg swelling.  Gastrointestinal: Negative for abdominal pain, diarrhea, nausea and vomiting.  Endocrine: Negative for cold intolerance, heat intolerance, polydipsia, polyphagia and polyuria.  Skin: Negative for color change, rash and wound.  Neurological: Negative for dizziness, tremors, seizures, syncope, facial asymmetry, speech difficulty, weakness, light-headedness, numbness and headaches.  Psychiatric/Behavioral:  Negative for dysphoric mood and sleep disturbance. The patient is not nervous/anxious.     Past Medical History:  Diagnosis Date  . Allergy   . Ulcer    History reviewed. No pertinent surgical history. No Known Allergies No current outpatient prescriptions on file prior to visit.   No current facility-administered medications on file prior to visit.    Social History   Social History  . Marital status: Married    Spouse name: N/A  . Number of children: 3  . 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 Smoking status: Never Smoker  . Smokeless tobacco: Never Used  . Alcohol use No  . Drug use: No  . Sexual activity: Yes    Partners: Female   Other Topics Concern  . Not on file   Social History Narrative   Wife stays home with the children   Wears seatbelt when driving   Does own firearms but they are secured    Family History  Problem Relation Age of Onset  . Hyperlipidemia Father   . Hypertension Father   . Diabetes Paternal Uncle   . Stroke Maternal Grandfather   . Heart disease Paternal Grandmother   . Diabetes Paternal Grandfather   . Hyperlipidemia Paternal Grandfather   . Hypertension Paternal Grandfather        Objective:    BP 118/70   Pulse 72   Temp 98 F (36.7 C) (Oral)   Resp 16   Ht 5' 11.26" (1.81 m)  Wt 230 lb (104.3 kg)   SpO2 98%   BMI 31.84 kg/m  Physical Exam  Constitutional: He is oriented to person, place, and time. He appears well-developed and well-nourished. No distress.  HENT:  Head: Normocephalic and atraumatic.  Right Ear: External ear normal.  Left Ear: External ear normal.  Nose: Nose normal.  Mouth/Throat: Oropharynx is clear and moist.  Eyes: Pupils are equal, round, and reactive to light. Conjunctivae and EOM are normal.  Neck: Normal range of motion. Neck supple. Carotid bruit is not present. No thyromegaly present.  Cardiovascular: Normal  rate, regular rhythm, normal heart sounds and intact distal pulses.  Exam reveals no gallop and no friction rub.   No murmur heard. Pulmonary/Chest: Effort normal and breath sounds normal. He has no wheezes. He has no rales.  Abdominal: Soft. Bowel sounds are normal. He exhibits no distension and no mass. There is no tenderness. There is no rebound and no guarding.  Lymphadenopathy:    He has no cervical adenopathy.  Neurological: He is alert and oriented to person, place, and time. No cranial nerve deficit.  Skin: Skin is warm and dry. No rash noted. He is not diaphoretic.  Psychiatric: He has a normal mood and affect. His behavior is normal.  Nursing note and vitals reviewed.  No results found. Depression screen [facility] 2/9 12/16/2016 05/06/2016  Decreased Interest 0 0  Down, Depressed, Hopeless 0 0  PHQ - 2 Score 0 0   Fall Risk  12/16/2016 05/06/2016  Falls in the past year? No No        Assessment & Plan:   1. Obstructive sleep apnea   2. Hypercholesterolemia   3. Elevated LFTs   4. Bandemia    -New onset OSA on CPAP: compliance report reviewed in detail during visit; excellent compliance with CPAP and symptomatically improved.  Continue with CPAP therapy. -recommend weight loss, exercise for 30-60 minutes five days per week; recommend 1200 kcal restriction per day with a minimum of 60 grams of protein per day. -will repeat LFTs which were elevated at physical examination. -will also repeat CBC due to leukocytosis at physical.  -elevated cholesterol at last visit;  I recommend weight loss, exercise, and low-cholesterol low-fat food choices.  I recommend limiting red meat to once per week; I recommend limiting fried foods to once per month.    Orders Placed This Encounter  Procedures  . Comprehensive metabolic panel    Order Specific Question:   Has the patient fasted?    Answer:   No  . CBC with Differential/Platelet  . Lipid panel    Order Specific Question:   Has the  patient fasted?    Answer:   No  . Acute Hep Panel & Hep B Surface Ab   No orders of the defined types were placed in this encounter.   No Follow-up on file.   Oswell Say Elayne Guerin, M.D. Primary Care at [facility] previously [facility] [place] [address] [place], [place]  [address] [phone] phone [phone] fax

## 2016-12-28 DIAGNOSIS — I10 Essential (primary) hypertension: Secondary | ICD-10-CM | POA: Diagnosis not present

## 2016-12-28 DIAGNOSIS — G4733 Obstructive sleep apnea (adult) (pediatric): Secondary | ICD-10-CM | POA: Diagnosis not present

## 2017-01-27 DIAGNOSIS — I10 Essential (primary) hypertension: Secondary | ICD-10-CM | POA: Diagnosis not present

## 2017-01-27 DIAGNOSIS — G4733 Obstructive sleep apnea (adult) (pediatric): Secondary | ICD-10-CM | POA: Diagnosis not present

## 2017-02-27 DIAGNOSIS — I10 Essential (primary) hypertension: Secondary | ICD-10-CM | POA: Diagnosis not present

## 2017-02-27 DIAGNOSIS — G4733 Obstructive sleep apnea (adult) (pediatric): Secondary | ICD-10-CM | POA: Diagnosis not present

## 2017-03-05 ENCOUNTER — Encounter: Payer: Self-pay | Admitting: Family Medicine

## 2017-03-30 ENCOUNTER — Encounter: Payer: Self-pay | Admitting: Family Medicine

## 2017-04-06 ENCOUNTER — Encounter: Payer: Self-pay | Admitting: Family Medicine

## 2017-04-30 DIAGNOSIS — I10 Essential (primary) hypertension: Secondary | ICD-10-CM | POA: Diagnosis not present

## 2017-04-30 DIAGNOSIS — G4733 Obstructive sleep apnea (adult) (pediatric): Secondary | ICD-10-CM | POA: Diagnosis not present

## 2017-06-01 DIAGNOSIS — I10 Essential (primary) hypertension: Secondary | ICD-10-CM | POA: Diagnosis not present

## 2017-06-01 DIAGNOSIS — G4733 Obstructive sleep apnea (adult) (pediatric): Secondary | ICD-10-CM | POA: Diagnosis not present

## 2017-07-06 DIAGNOSIS — I10 Essential (primary) hypertension: Secondary | ICD-10-CM | POA: Diagnosis not present

## 2017-07-06 DIAGNOSIS — G4733 Obstructive sleep apnea (adult) (pediatric): Secondary | ICD-10-CM | POA: Diagnosis not present

## 2017-07-09 ENCOUNTER — Encounter: Payer: Self-pay | Admitting: Family Medicine

## 2017-07-11 ENCOUNTER — Encounter: Payer: Self-pay | Admitting: Family Medicine

## 2017-07-14 ENCOUNTER — Encounter: Payer: Self-pay | Admitting: Family Medicine

## 2017-07-20 NOTE — Telephone Encounter (Signed)
Please schedule patient on one of his listed days with me for new onset BP issues.

## 2017-07-28 ENCOUNTER — Ambulatory Visit: Payer: BLUE CROSS/BLUE SHIELD | Admitting: Family Medicine

## 2017-07-31 ENCOUNTER — Encounter: Payer: Self-pay | Admitting: Family Medicine

## 2017-07-31 ENCOUNTER — Ambulatory Visit (INDEPENDENT_AMBULATORY_CARE_PROVIDER_SITE_OTHER): Payer: BLUE CROSS/BLUE SHIELD | Admitting: Family Medicine

## 2017-07-31 VITALS — BP 128/86 | HR 74 | Temp 98.4°F | Resp 16 | Ht 70.0 in | Wt 222.0 lb

## 2017-07-31 DIAGNOSIS — Z6831 Body mass index (BMI) 31.0-31.9, adult: Secondary | ICD-10-CM

## 2017-07-31 DIAGNOSIS — E7800 Pure hypercholesterolemia, unspecified: Secondary | ICD-10-CM | POA: Diagnosis not present

## 2017-07-31 DIAGNOSIS — Z131 Encounter for screening for diabetes mellitus: Secondary | ICD-10-CM

## 2017-07-31 DIAGNOSIS — I10 Essential (primary) hypertension: Secondary | ICD-10-CM | POA: Diagnosis not present

## 2017-07-31 DIAGNOSIS — E6609 Other obesity due to excess calories: Secondary | ICD-10-CM

## 2017-07-31 DIAGNOSIS — G4733 Obstructive sleep apnea (adult) (pediatric): Secondary | ICD-10-CM

## 2017-07-31 MED ORDER — AMLODIPINE BESYLATE 5 MG PO TABS: 5.0000 mg | ORAL_TABLET | Freq: Every day | ORAL | 3 refills | Status: AC

## 2017-07-31 NOTE — Patient Instructions (Addendum)
   IF you received an x-ray today, you will receive an invoice from [facility]. Please contact [facility] at [phone] with questions or concerns regarding your invoice.   IF you received labwork today, you will receive an invoice from [facility]. Please contact [facility] at [phone] with questions or concerns regarding your invoice.   Our billing staff will not be able to assist you with questions regarding bills from these companies.  You will be contacted with the lab results as soon as they are available. The fastest way to get your results is to activate your My Chart account. Instructions are located on the last page of this paperwork. If you have not heard from us regarding the results in 2 weeks, please contact this office.      Managing Your Hypertension Hypertension is commonly called high blood pressure. This is when the force of your blood pressing against the walls of your arteries is too strong. Arteries are blood vessels that carry blood from your heart throughout your body. Hypertension forces the heart to work harder to pump blood, and may cause the arteries to become narrow or stiff. Having untreated or uncontrolled hypertension can cause heart attack, stroke, kidney disease, and other problems. What are blood pressure readings? A blood pressure reading consists of a higher number over a lower number. Ideally, your blood pressure should be below 120/80. The first ("top") number is called the systolic pressure. It is a measure of the pressure in your arteries as your heart beats. The second ("bottom") number is called the diastolic pressure. It is a measure of the pressure in your arteries as the heart relaxes. What does my blood pressure reading mean? Blood pressure is classified into four stages. Based on your blood pressure reading, your health care provider may use the following stages to determine what type of treatment you need, if any. Systolic  pressure and diastolic pressure are measured in a unit called mm Hg. Normal  Systolic pressure: below 120.  Diastolic pressure: below 80. Elevated  Systolic pressure: 120-129.  Diastolic pressure: below 80. Hypertension stage 1  Systolic pressure: 130-139.  Diastolic pressure: 80-89. Hypertension stage 2  Systolic pressure: 140 or above.  Diastolic pressure: 90 or above. What health risks are associated with hypertension? Managing your hypertension is an important responsibility. Uncontrolled hypertension can lead to:  A heart attack.  A stroke.  A weakened blood vessel (aneurysm).  Heart failure.  Kidney damage.  Eye damage.  Metabolic syndrome.  Memory and concentration problems.  What changes can I make to manage my hypertension? Hypertension can be managed by making lifestyle changes and possibly by taking medicines. Your health care provider will help you make a plan to bring your blood pressure within a normal range. Eating and drinking  Eat a diet that is high in fiber and potassium, and low in salt (sodium), added sugar, and fat. An example eating plan is called the DASH (Dietary Approaches to Stop Hypertension) diet. To eat this way: ? Eat plenty of fresh fruits and vegetables. Try to fill half of your plate at each meal with fruits and vegetables. ? Eat whole grains, such as whole wheat pasta, brown rice, or whole grain bread. Fill about one quarter of your plate with whole grains. ? Eat low-fat diary products. ? Avoid fatty cuts of meat, processed or cured meats, and poultry with skin. Fill about one quarter of your plate with lean proteins such as fish, chicken without skin, beans, eggs,   and tofu. ? Avoid premade and processed foods. These tend to be higher in sodium, added sugar, and fat.  Reduce your daily sodium intake. Most people with hypertension should eat less than 1,500 mg of sodium a day.  Limit alcohol intake to no more than 1 drink a day  for nonpregnant women and 2 drinks a day for men. One drink equals 12 oz of beer, 5 oz of wine, or 1 oz of hard liquor. Lifestyle  Work with your health care provider to maintain a healthy body weight, or to lose weight. Ask what an ideal weight is for you.  Get at least 30 minutes of exercise that causes your heart to beat faster (aerobic exercise) most days of the week. Activities may include walking, swimming, or biking.  Include exercise to strengthen your muscles (resistance exercise), such as weight lifting, as part of your weekly exercise routine. Try to do these types of exercises for 30 minutes at least 3 days a week.  Do not use any products that contain nicotine or tobacco, such as cigarettes and e-cigarettes. If you need help quitting, ask your health care provider.  Control any long-term (chronic) conditions you have, such as high cholesterol or diabetes. Monitoring  Monitor your blood pressure at home as told by your health care provider. Your personal target blood pressure may vary depending on your medical conditions, your age, and other factors.  Have your blood pressure checked regularly, as often as told by your health care provider. Working with your health care provider  Review all the medicines you take with your health care provider because there may be side effects or interactions.  Talk with your health care provider about your diet, exercise habits, and other lifestyle factors that may be contributing to hypertension.  Visit your health care provider regularly. Your health care provider can help you create and adjust your plan for managing hypertension. Will I need medicine to control my blood pressure? Your health care provider may prescribe medicine if lifestyle changes are not enough to get your blood pressure under control, and if:  Your systolic blood pressure is 130 or higher.  Your diastolic blood pressure is 80 or higher.  Take medicines only as told  by your health care provider. Follow the directions carefully. Blood pressure medicines must be taken as prescribed. The medicine does not work as well when you skip doses. Skipping doses also puts you at risk for problems. Contact a health care provider if:  You think you are having a reaction to medicines you have taken.  You have repeated (recurrent) headaches.  You feel dizzy.  You have swelling in your ankles.  You have trouble with your vision. Get help right away if:  You develop a severe headache or confusion.  You have unusual weakness or numbness, or you feel faint.  You have severe pain in your chest or abdomen.  You vomit repeatedly.  You have trouble breathing. Summary  Hypertension is when the force of blood pumping through your arteries is too strong. If this condition is not controlled, it may put you at risk for serious complications.  Your personal target blood pressure may vary depending on your medical conditions, your age, and other factors. For most people, a normal blood pressure is less than 120/80.  Hypertension is managed by lifestyle changes, medicines, or both. Lifestyle changes include weight loss, eating a healthy, low-sodium diet, exercising more, and limiting alcohol. This information is not intended to replace advice   given to you by your health care provider. Make sure you discuss any questions you have with your health care provider. Document Released: 11/04/2011 Document Revised: 01/08/2016 Document Reviewed: 01/08/2016 Elsevier Interactive Patient Education  2018 [employer].  
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
## 2017-07-31 NOTE — Addendum Note (Signed)
Addended by: Burnis Kingfisher on: 07/31/2017 09:32 AM   Modules accepted: Orders

## 2017-08-01 LAB — COMPREHENSIVE METABOLIC PANEL
ALT: 44 IU/L (ref 0–44)
AST: 20 IU/L (ref 0–40)
Albumin/Globulin Ratio: 1.6 (ref 1.2–2.2)
Albumin: 4.4 g/dL (ref 3.5–5.5)
Alkaline Phosphatase: 83 IU/L (ref 39–117)
BUN/Creatinine Ratio: 19 (ref 9–20)
BUN: 17 mg/dL (ref 6–20)
Bilirubin Total: 0.6 mg/dL (ref 0.0–1.2)
CALCIUM: 9.1 mg/dL (ref 8.7–10.2)
CO2: 22 mmol/L (ref 20–29)
CREATININE: 0.89 mg/dL (ref 0.76–1.27)
Chloride: 104 mmol/L (ref 96–106)
GFR, EST AFRICAN AMERICAN: 132 mL/min/{1.73_m2} (ref >59)
GFR, EST NON AFRICAN AMERICAN: 114 mL/min/{1.73_m2} (ref >59)
GLOBULIN, TOTAL: 2.7 g/dL (ref 1.5–4.5)
Glucose: 103 mg/dL — ABNORMAL HIGH (ref 65–99)
POTASSIUM: 4 mmol/L (ref 3.5–5.2)
Sodium: 141 mmol/L (ref 134–144)
TOTAL PROTEIN: 7.1 g/dL (ref 6.0–8.5)

## 2017-08-01 LAB — CBC WITH DIFFERENTIAL/PLATELET
Basophils Absolute: 0 10*3/uL (ref 0.0–0.2)
Basos: 0 %
EOS (ABSOLUTE): 0.2 10*3/uL (ref 0.0–0.4)
EOS: 3 %
HEMATOCRIT: 43.8 % (ref 37.5–51.0)
HEMOGLOBIN: 14.6 g/dL (ref 13.0–17.7)
IMMATURE GRANS (ABS): 0 10*3/uL (ref 0.0–0.1)
IMMATURE GRANULOCYTES: 0 %
LYMPHS: 29 %
Lymphocytes Absolute: 2.1 10*3/uL (ref 0.7–3.1)
MCH: 28.5 pg (ref 26.6–33.0)
MCHC: 33.3 g/dL (ref 31.5–35.7)
MCV: 86 fL (ref 79–97)
Monocytes Absolute: 0.6 10*3/uL (ref 0.1–0.9)
Monocytes: 8 %
NEUTROS ABS: 4.3 10*3/uL (ref 1.4–7.0)
NEUTROS PCT: 60 %
PLATELETS: 247 10*3/uL (ref 150–450)
RBC: 5.12 x10E6/uL (ref 4.14–5.80)
RDW: 13.3 % (ref 12.3–15.4)
WBC: 7.2 10*3/uL (ref 3.4–10.8)

## 2017-08-01 LAB — LIPID PANEL
Chol/HDL Ratio: 4.1 ratio (ref 0.0–5.0)
Cholesterol, Total: 172 mg/dL (ref 100–199)
HDL: 42 mg/dL (ref >39)
LDL Calculated: 110 mg/dL — ABNORMAL HIGH (ref 0–99)
Triglycerides: 101 mg/dL (ref 0–149)
VLDL CHOLESTEROL CAL: 20 mg/dL (ref 5–40)

## 2017-08-01 LAB — TSH: TSH: 2.65 u[IU]/mL (ref 0.450–4.500)

## 2017-08-01 LAB — HEMOGLOBIN A1C
Est. average glucose Bld gHb Est-mCnc: 108 mg/dL
Hgb A1c MFr Bld: 5.4 % (ref 4.8–5.6)

## 2017-10-20 DIAGNOSIS — I10 Essential (primary) hypertension: Secondary | ICD-10-CM | POA: Diagnosis not present

## 2017-10-20 DIAGNOSIS — G4733 Obstructive sleep apnea (adult) (pediatric): Secondary | ICD-10-CM | POA: Diagnosis not present

## 2017-10-27 DIAGNOSIS — I10 Essential (primary) hypertension: Secondary | ICD-10-CM | POA: Diagnosis not present

## 2017-10-27 DIAGNOSIS — G4733 Obstructive sleep apnea (adult) (pediatric): Secondary | ICD-10-CM | POA: Diagnosis not present

## 2017-11-16 DIAGNOSIS — I10 Essential (primary) hypertension: Secondary | ICD-10-CM | POA: Diagnosis not present

## 2017-11-16 DIAGNOSIS — J301 Allergic rhinitis due to pollen: Secondary | ICD-10-CM | POA: Diagnosis not present

## 2017-11-16 DIAGNOSIS — D352 Benign neoplasm of pituitary gland: Secondary | ICD-10-CM | POA: Diagnosis not present

## 2017-11-16 DIAGNOSIS — G4733 Obstructive sleep apnea (adult) (pediatric): Secondary | ICD-10-CM | POA: Diagnosis not present

## 2017-11-25 DIAGNOSIS — I10 Essential (primary) hypertension: Secondary | ICD-10-CM | POA: Diagnosis not present

## 2017-11-25 DIAGNOSIS — G4733 Obstructive sleep apnea (adult) (pediatric): Secondary | ICD-10-CM | POA: Diagnosis not present

## 2017-12-27 DIAGNOSIS — G4733 Obstructive sleep apnea (adult) (pediatric): Secondary | ICD-10-CM | POA: Diagnosis not present

## 2017-12-27 DIAGNOSIS — I10 Essential (primary) hypertension: Secondary | ICD-10-CM | POA: Diagnosis not present

## 2018-01-26 DIAGNOSIS — G4733 Obstructive sleep apnea (adult) (pediatric): Secondary | ICD-10-CM | POA: Diagnosis not present

## 2018-01-26 DIAGNOSIS — I10 Essential (primary) hypertension: Secondary | ICD-10-CM | POA: Diagnosis not present

## 2018-03-14 DIAGNOSIS — D352 Benign neoplasm of pituitary gland: Secondary | ICD-10-CM | POA: Diagnosis not present

## 2018-03-15 ENCOUNTER — Other Ambulatory Visit: Payer: Self-pay | Admitting: Internal Medicine

## 2018-03-15 ENCOUNTER — Other Ambulatory Visit (HOSPITAL_COMMUNITY): Payer: Self-pay | Admitting: Internal Medicine

## 2018-03-15 DIAGNOSIS — D352 Benign neoplasm of pituitary gland: Secondary | ICD-10-CM

## 2018-03-29 ENCOUNTER — Ambulatory Visit
Admission: RE | Admit: 2018-03-29 | Discharge: 2018-03-29 | Disposition: A | Discharge status: Home / Self Care | Payer: BLUE CROSS/BLUE SHIELD | Source: Ambulatory Visit | Attending: Internal Medicine | Admitting: Internal Medicine

## 2018-03-29 DIAGNOSIS — D352 Benign neoplasm of pituitary gland: Secondary | ICD-10-CM | POA: Diagnosis not present

## 2018-03-29 LAB — POCT I-STAT CREATININE: Creatinine, Ser: 0.9 mg/dL (ref 0.61–1.24)

## 2018-03-29 MED ORDER — GADOBUTROL 1 MMOL/ML IV SOLN
10.0000 mL | Freq: Once | INTRAVENOUS | Status: AC | PRN
  Administered 2018-03-29: 10 mL via INTRAVENOUS

## 2018-05-06 DIAGNOSIS — G4733 Obstructive sleep apnea (adult) (pediatric): Secondary | ICD-10-CM | POA: Diagnosis not present

## 2018-05-06 DIAGNOSIS — I10 Essential (primary) hypertension: Secondary | ICD-10-CM | POA: Diagnosis not present

## 2018-08-04 DIAGNOSIS — G4733 Obstructive sleep apnea (adult) (pediatric): Secondary | ICD-10-CM | POA: Diagnosis not present

## 2018-08-04 DIAGNOSIS — I10 Essential (primary) hypertension: Secondary | ICD-10-CM | POA: Diagnosis not present

## 2018-09-16 DIAGNOSIS — Z13220 Encounter for screening for lipoid disorders: Secondary | ICD-10-CM | POA: Diagnosis not present

## 2018-09-16 DIAGNOSIS — Z131 Encounter for screening for diabetes mellitus: Secondary | ICD-10-CM | POA: Diagnosis not present

## 2018-09-16 DIAGNOSIS — E669 Obesity, unspecified: Secondary | ICD-10-CM | POA: Diagnosis not present

## 2018-09-16 DIAGNOSIS — Z1159 Encounter for screening for other viral diseases: Secondary | ICD-10-CM | POA: Diagnosis not present

## 2018-09-16 DIAGNOSIS — Z114 Encounter for screening for human immunodeficiency virus [HIV]: Secondary | ICD-10-CM | POA: Diagnosis not present

## 2020-12-23 IMAGING — MR MR HEAD WO/W CM
12 of 18 series · 26 of 48 positions shown · IV contrast (gadavist)
Comparison: 11/30/2011

CLINICAL DATA: Followup pituitary micro adenoma.

EXAM:
MRI HEAD WITHOUT AND WITH CONTRAST
TECHNIQUE: Multiplanar, multiecho pulse sequences of the brain and surrounding
structures were obtained without and with intravenous contrast.
CONTRAST:  10 cc Gadavist

[Series 5: T1 · sagittal · 5.0mm · 0.62mm/px · 2 of 25 slices shown]
[im 1/25]
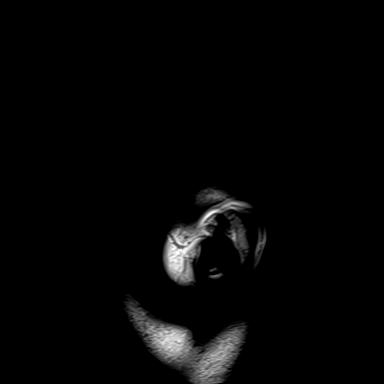
[im 25/25]
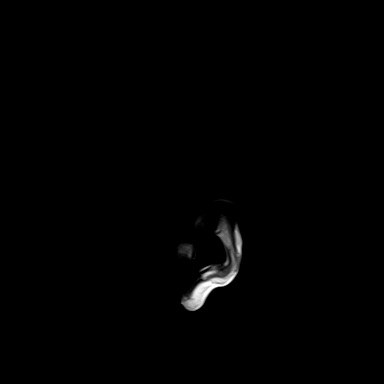

[Series 8: T2 · axial · 5.0mm · 0.53mm/px · z∈[+6,+155]mm · 2 of 27 slices shown]
[im 1/27]
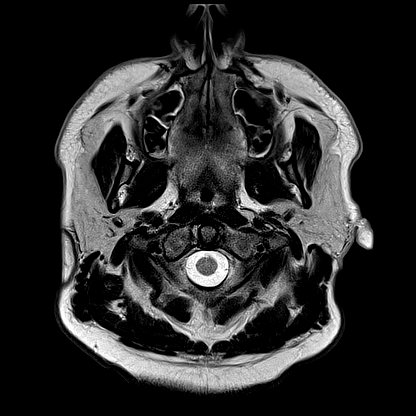
[im 27/27]
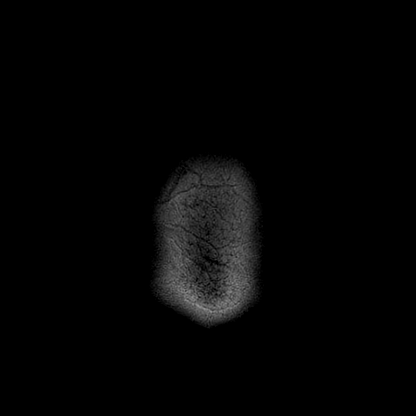

[Series 10: FLAIR · axial · 3.0mm · 0.53mm/px · z∈[+1,+155]mm · 5 of 55 slices shown]
[im 1/55]
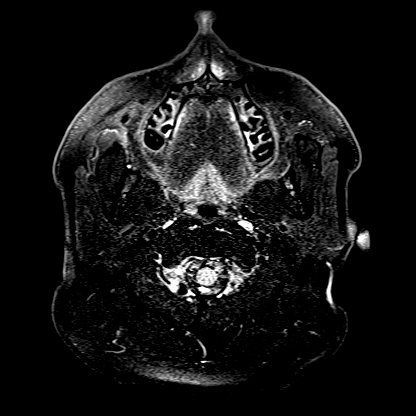
[im 14/55]
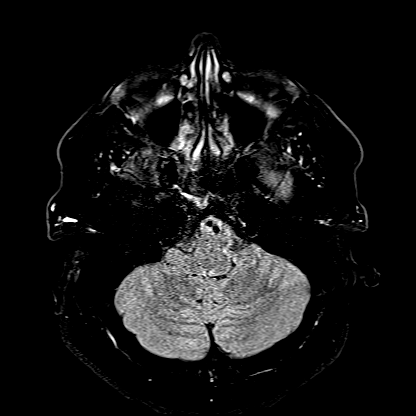
[im 28/55]
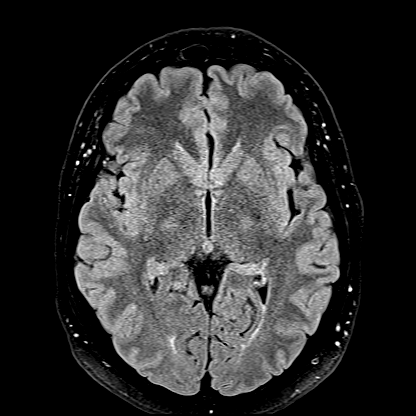
[im 41/55]
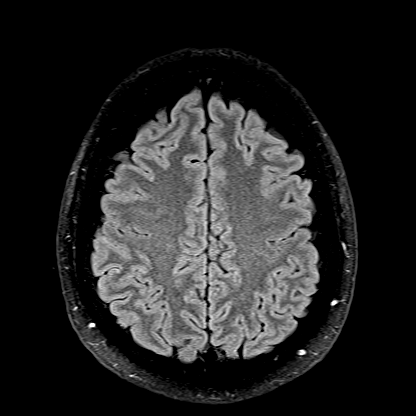
[im 55/55]
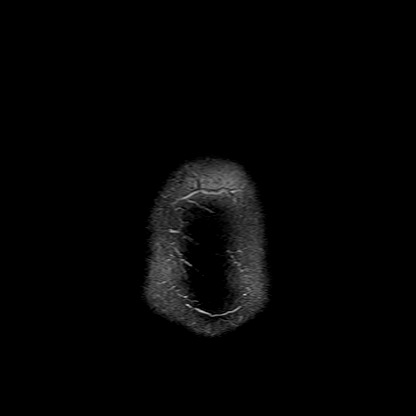

[Series 14: T1 post-contrast · coronal · 3.0mm · 0.28mm/px · 1 of 11 slices shown (1 of 9)]
[im 1/11]
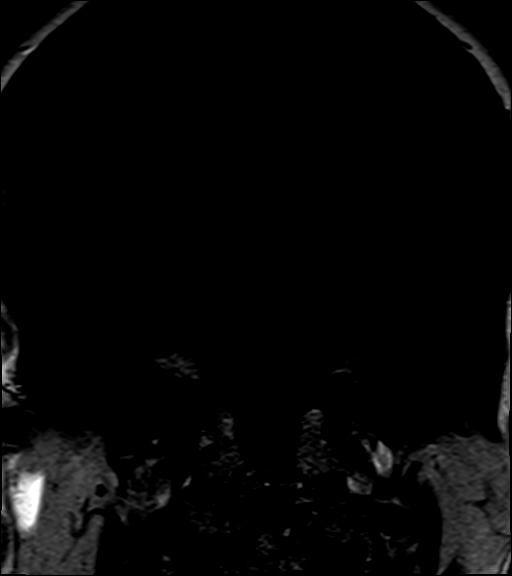

[Series 15: T1 post-contrast · coronal · 3.0mm · 0.28mm/px · 1 of 11 slices shown (2 of 9)]
[im 1/11]
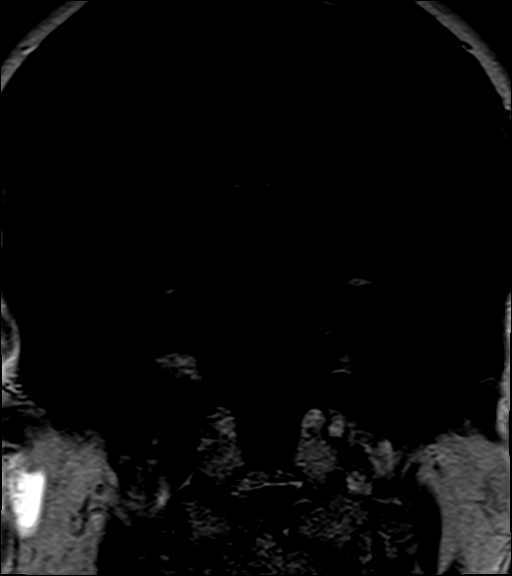

[Series 16: T1 post-contrast · coronal · 3.0mm · 0.28mm/px · 1 of 11 slices shown (3 of 9)]
[im 1/11]
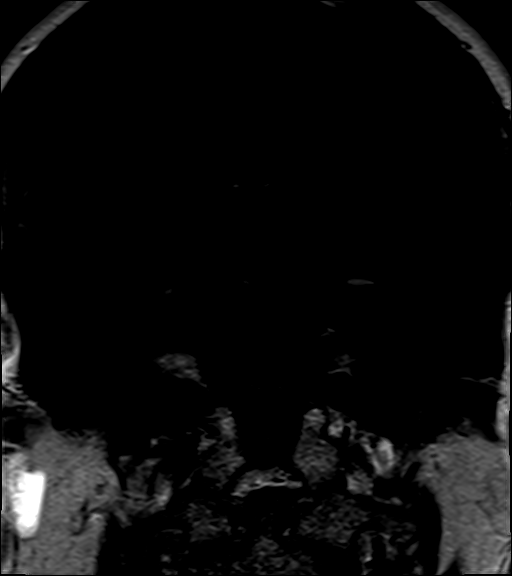

[Series 17: T1 post-contrast · coronal · 3.0mm · 0.28mm/px · 1 of 11 slices shown (4 of 9)]
[im 1/11]
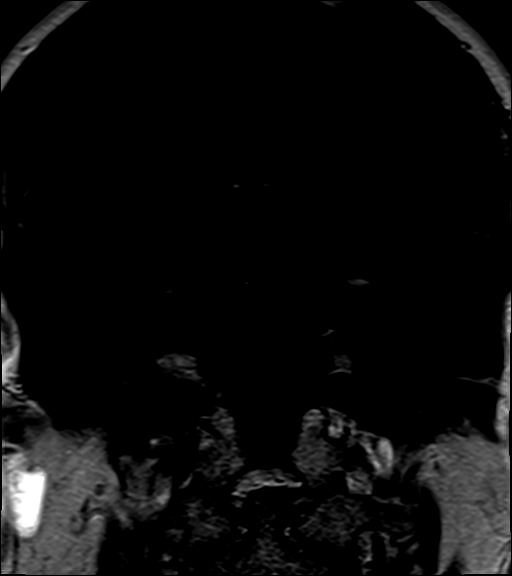

[Series 18: T1 post-contrast · coronal · 3.0mm · 0.28mm/px · 1 of 11 slices shown (5 of 9)]
[im 1/11]
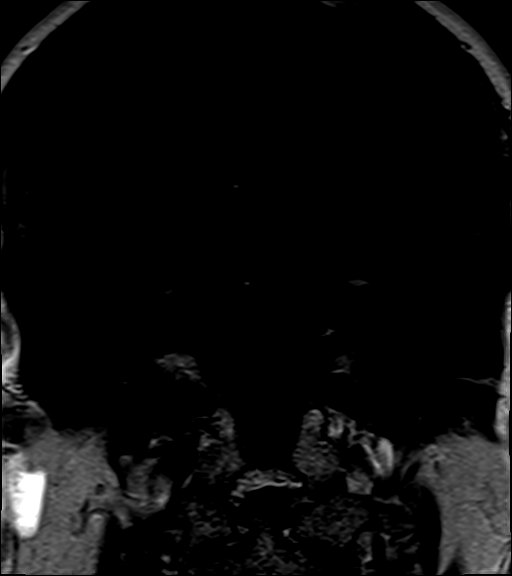

[Series 19: T1 post-contrast · coronal · 3.0mm · 0.21mm/px · 1 of 13 slices shown (6 of 9)]
[im 1/13]
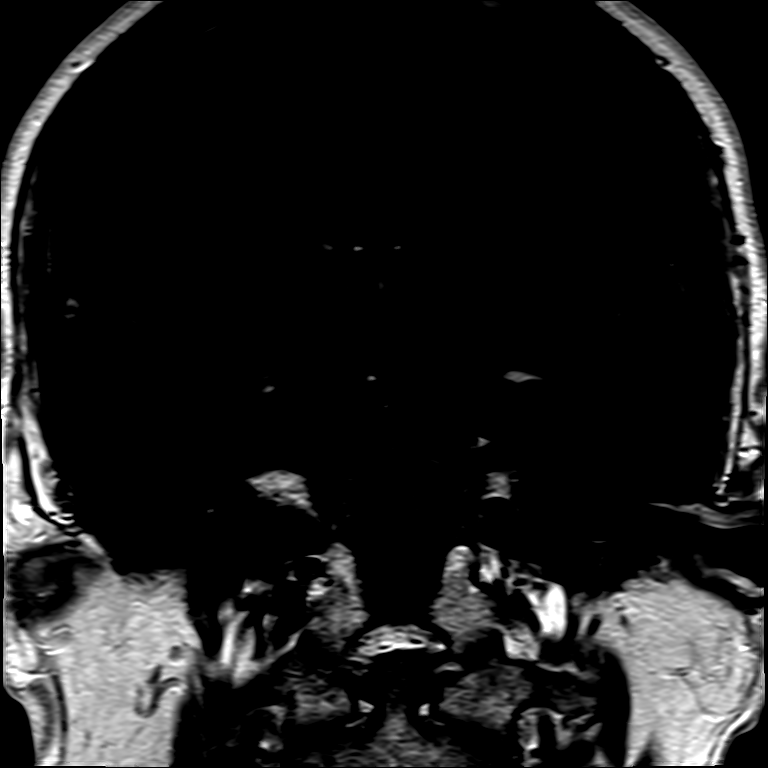

[Series 20: T1 post-contrast · sagittal · 3.0mm · 0.21mm/px · 1 of 13 slices shown (7 of 9)]
[im 1/13]
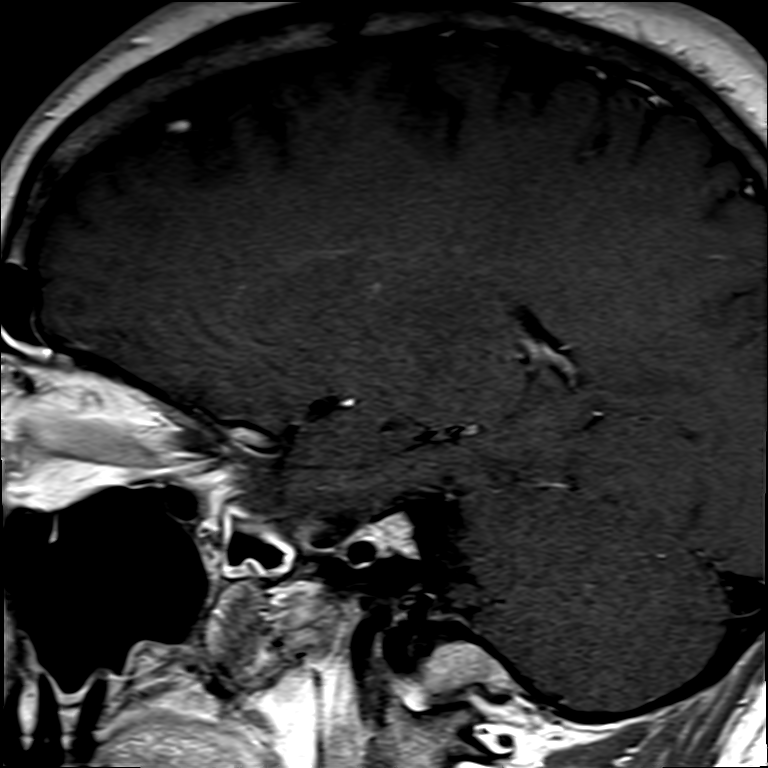

[Series 21: T1 post-contrast · axial · 1.0mm · 0.98mm/px · z∈[-1,+166]mm · 8 of 176 slices shown (8 of 9)]
[im 1/176]
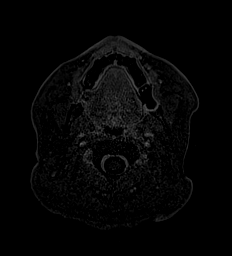
[im 26/176]
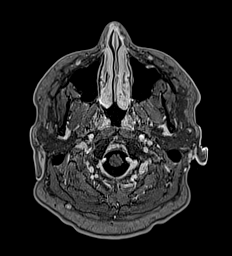
[im 51/176]
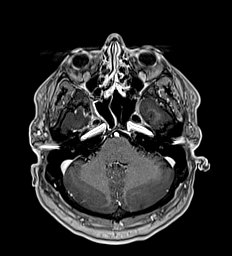
[im 76/176]
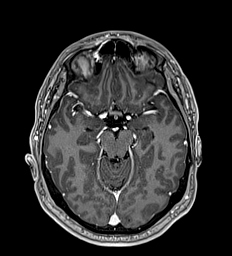
[im 101/176]
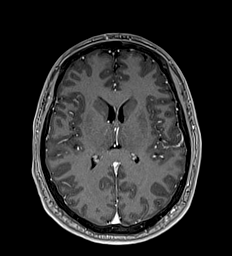
[im 126/176]
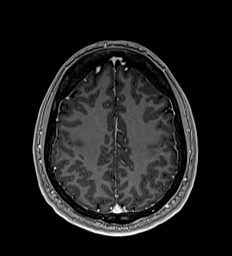
[im 151/176]
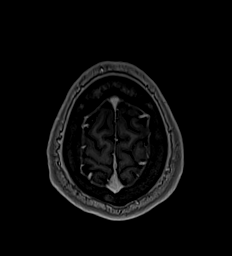
[im 176/176]
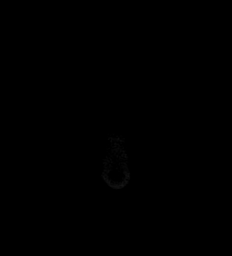

[Series 22: T1 post-contrast · coronal · 5.0mm · 0.57mm/px · 2 of 29 slices shown (9 of 9)]
[im 1/29]
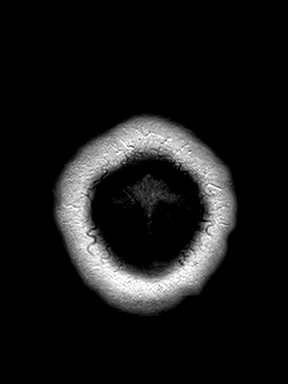
[im 29/29]
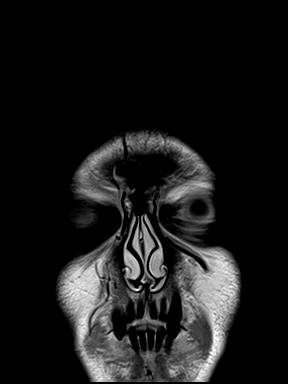

[26 of 48 positions shown; findings below may reference images not displayed]

FINDINGS: Brain: Brain itself has normal appearance without evidence of
malformation, atrophy, old or acute infarction, intra-axial mass
lesion, hemorrhage, hydrocephalus or extra-axial collection.

The pituitary gland itself shows normal size and contour with a
concave upper margin. The infundibulum is midline. Within the left
anterior corner of the gland, there is an equivocal 3 mm focus of
hypoenhancement on the early phase images which could represent a
stable chronic micro adenoma. There is no evidence that there is any
growing tumor in this location. Patient has asymmetry of the
posterior clinoid process on the right which is a stable finding and
is fairly convincingly bone on this examination rather than a
meningioma or exophytic adenoma.

Vascular: Major vessels at the base of the brain show flow.

Skull and upper cervical spine: Normal

Sinuses/Orbits: Clear/normal

Other: None
IMPRESSION: Equivocal 3 mm hypoenhancing focus within the left anterior gland
which could represent a micro adenoma. This is not conclusive. There
is certainly not any growing lesion in this location.

The previous described abnormality in the posterior right parasellar
region relates to an asymmetric posterior clinoid process and does
not represent a meningioma or exophytic pituitary mass.
